# Patient Record
Sex: Female | Born: 1995 | Race: Black or African American | Hispanic: No | Marital: Single | Smoking: Never smoker
Health system: Southern US, Community
[De-identification: ages and names within clinical notes are randomized; demographics above are authoritative.]

## PROBLEM LIST (undated history)

## (undated) HISTORY — PX: HERNIA REPAIR: SHX51

---
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
## 2016-07-07 IMAGING — CR DG TOE GREAT 2+V*L*
3 series · 3 of 3 positions shown · non-contrast
Comparison: None.

CLINICAL DATA: Left great toe pain beginning 1 week ago,
intermittent. Pain is located at the first metatarsophalangeal joint
radiating into the proximal phalanx.

EXAM:
LEFT GREAT TOE

[x toes ap left]
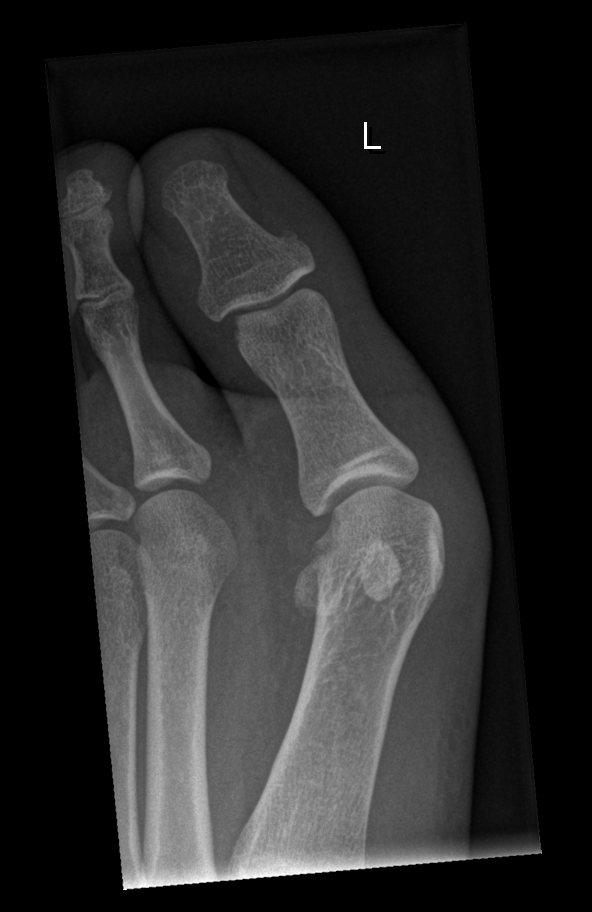

[x toes obl left]
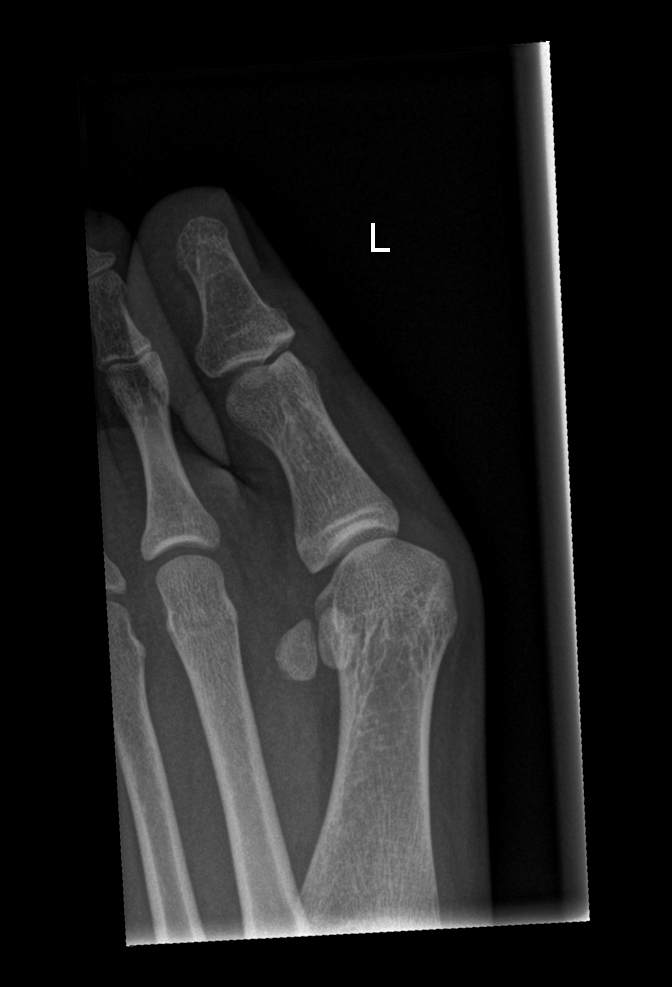

[x toes lat left]
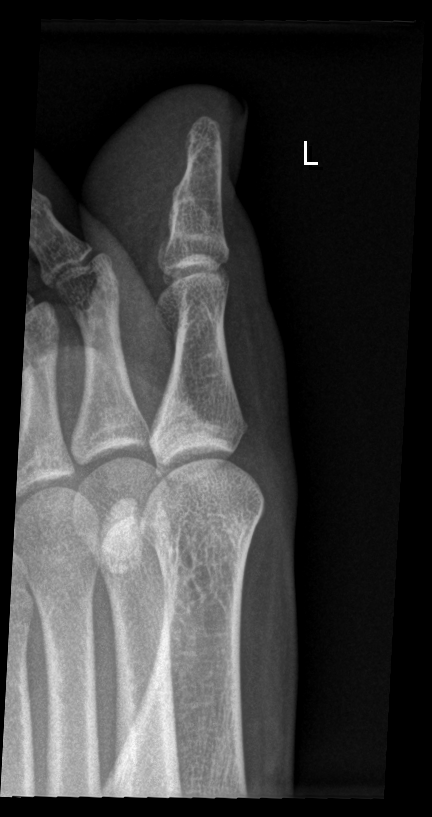

[3 of 3 positions shown; findings below may reference images not displayed]

FINDINGS: There is hallux valgus angulation. No acute fracture or dislocation
is identified. Joint space widths are preserved. No osseous erosion
or soft tissue abnormality is seen.
IMPRESSION: No acute osseous abnormality identified.  Hallux valgus angulation.

## 2017-06-13 ENCOUNTER — Encounter (HOSPITAL_COMMUNITY): Payer: Self-pay | Admitting: *Deleted

## 2017-06-13 ENCOUNTER — Ambulatory Visit (HOSPITAL_COMMUNITY)
Admission: EM | Admit: 2017-06-13 | Discharge: 2017-06-13 | Disposition: A | Discharge status: Home / Self Care | Payer: Managed Care, Other (non HMO) | Attending: Physician Assistant | Admitting: Physician Assistant

## 2017-06-13 DIAGNOSIS — N898 Other specified noninflammatory disorders of vagina: Secondary | ICD-10-CM | POA: Diagnosis not present

## 2017-06-13 DIAGNOSIS — R21 Rash and other nonspecific skin eruption: Secondary | ICD-10-CM | POA: Insufficient documentation

## 2017-06-13 DIAGNOSIS — B373 Candidiasis of vulva and vagina: Secondary | ICD-10-CM | POA: Insufficient documentation

## 2017-06-13 DIAGNOSIS — B3731 Acute candidiasis of vulva and vagina: Secondary | ICD-10-CM

## 2017-06-13 MED ORDER — FLUCONAZOLE 150 MG PO TABS: 150.0000 mg | ORAL_TABLET | Freq: Every day | ORAL | 0 refills | Status: DC

## 2017-06-13 NOTE — Discharge Instructions (Signed)
You have test pending 

## 2017-06-13 NOTE — ED Provider Notes (Signed)
[facility]    CSN: [id number] Arrival date & time: 06/13/17  2004     History   Chief Complaint Chief Complaint  Patient presents with  . Rash    HPI Amber Contreras is a 21 y.o. female.   The history is provided by the patient. No language interpreter was used.  Vaginal Itching  This is a new problem. The current episode started more than 2 days ago. The problem occurs constantly. The problem has been gradually worsening. Nothing aggravates the symptoms. Nothing relieves the symptoms. She has tried nothing for the symptoms. The treatment provided no relief.  Pt is concerned that she has genital warts.  Pt has had in the past.  School rn thought she had.  Pt complains of lumps and itching.  History reviewed. No pertinent past medical history.  There are no active problems to display for this patient.   History reviewed. No pertinent surgical history.  OB History    No data available       Home Medications    Prior to Admission medications   Medication Sig Start Date End Date Taking? Authorizing Provider  fluconazole (DIFLUCAN) 150 MG tablet Take 1 tablet (150 mg total) by mouth daily. 06/13/17   Elson AreasSofia, Yaira Bernardi K, PA-C  naproxen (NAPROSYN) 500 MG tablet Take 1 tablet (500 mg total) by mouth 2 (two) times daily. 12/06/14   Jaynie CrumbleKirichenko, Tatyana, PA-C    Family History History reviewed. No pertinent family history.  Social History Social History  Substance Use Topics  . Smoking status: Never Smoker  . Smokeless tobacco: Not on file  . Alcohol use No     Allergies   Patient has no known allergies.   Review of Systems Review of Systems  All other systems reviewed and are negative.    Physical Exam Triage Vital Signs ED Triage Vitals [06/13/17 2026]  Enc Vitals Group     BP 112/72     Pulse Rate 78     Resp 18     Temp 98.6 F (37 C)     Temp Source Oral     SpO2 99 %     Weight      Height      Head Circumference      Peak Flow    Pain Score      Pain Loc      Pain Edu?      Excl. in GC?    No data found.   Updated Vital Signs BP 112/72 (BP Location: Right Arm)   Pulse 78   Temp 98.6 F (37 C) (Oral)   Resp 18   LMP 05/27/2017   SpO2 99%   Visual Acuity Right Eye Distance:   Left Eye Distance:   Bilateral Distance:    Right Eye Near:   Left Eye Near:    Bilateral Near:     Physical Exam  Constitutional: She appears well-developed and well-nourished.  HENT:  Head: Normocephalic.  Eyes: Pupils are equal, round, and reactive to light.  Cardiovascular: Normal rate and regular rhythm.   Pulmonary/Chest: Effort normal.  Abdominal: Soft.  Genitourinary: Vaginal discharge found.  Genitourinary Comments: Thick white discharge,  No sign of warts.    Musculoskeletal: Normal range of motion.  Neurological: She is alert.  Skin: Skin is warm.  Psychiatric: She has a normal mood and affect.  Nursing note and vitals reviewed.    UC Treatments / Results  Labs (all labs ordered are listed, but  only abnormal results are displayed) Labs Reviewed  CERVICOVAGINAL ANCILLARY ONLY    EKG  EKG Interpretation None       Radiology No results found.  Procedures Procedures (including critical care time)  Medications Ordered in UC Medications - No data to display   Initial Impression / Assessment and Plan / UC Course  I have reviewed the triage vital signs and the nursing notes.  Pertinent labs & imaging results that were available during my care of the patient were reviewed by me and considered in my medical decision making (see chart for details).     Gc/ct/affirm ordered.  I suspect yeast.  Pt given diflucan.  Pt given number for [facility] for follow up  Final Clinical Impressions(s) / UC Diagnoses   Final diagnoses:  Yeast vaginitis    New Prescriptions Discharge Medication List as of 06/13/2017  8:36 PM     Meds ordered this encounter  Medications  . fluconazole (DIFLUCAN) 150  MG tablet    Sig: Take 1 tablet (150 mg total) by mouth daily.    Dispense:  1 tablet    Refill:  0    Order Specific Question:   Supervising Provider    Answer:   Eustace Moore [[id number]]  An After Visit Summary was printed and given to the patient.  Controlled Substance Prescriptions [place] Controlled Substance Registry consulted? No   Elson Areas, [place] 06/13/17 2051

## 2017-06-13 NOTE — ED Triage Notes (Signed)
Pt  Has  Bumps   And  Rash     For   About  5    Days        In  Vaginal   Area     Pt   Also  Reports   Some    abd  Pain      Went  To the   schooll nurse  A  Few  Days  Ago  And  Was  Told  To   Go  To  The  Health   Nurse

## 2017-06-18 ENCOUNTER — Encounter: Payer: Self-pay | Admitting: General Practice

## 2017-06-18 ENCOUNTER — Ambulatory Visit (INDEPENDENT_AMBULATORY_CARE_PROVIDER_SITE_OTHER): Payer: Managed Care, Other (non HMO) | Admitting: Obstetrics & Gynecology

## 2017-06-18 ENCOUNTER — Encounter: Payer: Self-pay | Admitting: Obstetrics & Gynecology

## 2017-06-18 ENCOUNTER — Ambulatory Visit (INDEPENDENT_AMBULATORY_CARE_PROVIDER_SITE_OTHER): Payer: Managed Care, Other (non HMO) | Admitting: Clinical

## 2017-06-18 DIAGNOSIS — F4323 Adjustment disorder with mixed anxiety and depressed mood: Secondary | ICD-10-CM

## 2017-06-18 DIAGNOSIS — L739 Follicular disorder, unspecified: Secondary | ICD-10-CM | POA: Diagnosis not present

## 2017-06-18 MED ORDER — SULFAMETHOXAZOLE-TRIMETHOPRIM 800-160 MG PO TABS: 1.0000 | ORAL_TABLET | Freq: Two times a day (BID) | ORAL | 0 refills | Status: DC

## 2017-06-18 MED ORDER — FLUCONAZOLE 150 MG PO TABS: 150.0000 mg | ORAL_TABLET | Freq: Every day | ORAL | 0 refills | Status: DC

## 2017-06-18 NOTE — BH Specialist Note (Signed)
Integrated Behavioral Health Initial Visit  MRN: [id number] Name: Amber Contreras   Session Start time: 4:20 Session End time: 4:38 Total time: 20 minutes  Type of Service: Integrated Behavioral Health- Individual/Family Interpretor:No. Interpretor Name and Language: n/a   Warm Hand Off Completed.       SUBJECTIVE: Amber Contreras is a 21 y.o. female accompanied by patient. Patient was referred by Dr Debroah LoopArnold for anxiety and depression. Patient reports the following symptoms/concerns: Pt states her primary concern is stress over increased responsibilities at school; open to learning coping strategy.  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 Patient may benefit from psychoeducation and brief therapeutic interventions regarding coping with symptoms of anxiety and depression.  PLAN: 1. Follow up with behavioral health clinician on : As needed 2. Behavioral recommendations:  -Attend appointment with school guidance counselor on 06-24-17 -Continue using Calm app on phone, as needed -Consider practicing CALM relaxation breathing exercises with breakfast  -Read educational materials regarding coping with  symptoms of anxiety and depression 3. Referral(s): [facility] 4. "From scale of 1-10, how likely are you to follow plan?": 7  Rae LipsJamie C Sania Noy, LCSWA Depression screen [facility] 2/9 06/18/2017  Decreased Interest 3  Down, Depressed, Hopeless 1  PHQ - 2 Score 4  Altered sleeping 2  Tired, decreased energy 3  Change in appetite 3  Feeling bad or failure about yourself  1  Trouble concentrating 0  Moving slowly or fidgety/restless 0  Suicidal thoughts 0  PHQ-9 Score 13   GAD 7 : Generalized Anxiety Score 06/18/2017  Nervous, Anxious, on Edge 2  Control/stop worrying 2  Worry too much - different things 2  Trouble relaxing 2  Restless 0  Easily annoyed or irritable 1  Afraid - awful might happen 3  Total GAD 7 Score 12

## 2017-06-18 NOTE — Progress Notes (Signed)
Obstetrics and Gynecology Visit New Patient Evaluation  Appointment Date: 06/18/2017  Primary Care Provider: Patient, No Pcp Per  Referring Provider: No ref. provider found  Chief Complaint: Vaginal itching  History of Present Illness:  Amber Contreras is a 21 y.o. G1P0010 who presents for ED f/u for yeast infection. [facility] with ddx of yeast infection and discharged on 150mg  oral diflucan. She reports that they do not look like pimples, but she can express pus when she presses on them.   Gyn history:  She [place] Ob/Gyn [place] who she has been seeing since age 21. She initiated care for TAB. 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 She stated that she had cryo procedure at 21yo. She states that previous abdominal pain stopped after the cryo procedure. She reports that she had to go every [id number] for pap smears, and her provider would not give depo shot without pap smear. She has questionable hx of genital warts for which she was taking a cream. She stopped the depo shot in November and is not currently sexually active. She reports regular menstrual cycles. [facility].  Interval History: Since the ED, she states that she has had persistent bumps. She now reports itchiness around the vulva. Itching associated with bumps.   Review of Systems: Positive for diarrhea for 1wk with + bright red blood in the stool. Otherwise, her 12 point review of systems is negative or as noted in the History of Present Illness.  The following portions of the patient's history were reviewed and updated as appropriate: allergies, current medications, past family history, past medical history, past social history, past surgical history and problem list.  Patient Active Problem List   Diagnosis Date Noted  . Folliculitis 06/18/2017   Medications: None Allergies: has No Known  Allergies.  Physical Exam:  LMP 05/27/2017  There is no height or weight on file to calculate BMI. General appearance: Well nourished, well developed female in no acute distress.  Abdomen: diffusely non tender to palpation, non distended, and no masses, hernias Neuro/Psych:  Normal mood and affect.   Pelvic exam:  Vulva: Small scattered pustules visible on mons and vulva without surround erythema or fluctuance Vagina: Blood visible in vaginal vault, no discharge appreciated, normal odor Cervix: posterior orientation, blood expressing from os, no discharge Uterus: uterus is normal size, shape, consistency and nontender Adnexa: normal adnexa in size, nontender and no masses.   Assessment: History and physical exam consistent with mild folliculitis. Pt beginning menstrual cycle.  Plan: 1. Folliculitis - 7d course of oral bactrim  - Return precautions given regarding new or worsening symptoms.  2. Health maintenance - Discussed current guidelines for pap smears and provided reassurance - Pt instructed to return at age 21 for pap per guidelines  RTC: prn  Dannette Barbararew Jamile Sivils, Medical Student

## 2017-06-18 NOTE — Patient Instructions (Signed)
Pap Test Why am I having this test? A pap test is sometimes called a pap smear. It is a screening test that is used to check for signs of cancer of the vagina, cervix, and uterus. The test can also identify the presence of infection or precancerous changes. Your health care provider will likely recommend you have this test done on a regular basis. This test may be done:  Every 3 years, starting at age 21.  Every 5 years, in combination with testing for the presence of human papillomavirus (HPV).after age 21  More or less often depending on other medical conditions.  What kind of sample is taken? Using a small cotton swab, plastic spatula, or brush, your health care provider will collect a sample of cells from the surface of your cervix. Your cervix is the opening to your uterus, also called a womb. Secretions from the cervix and vagina may also be collected. How do I prepare for this test?  Be aware of where you are in your menstrual cycle. You may be asked to reschedule the test if you are menstruating on the day of the test.  You may need to reschedule if you have a known vaginal infection on the day of the test.  You may be asked to avoid douching or taking a bath the day before or the day of the test.  Some medicines can cause abnormal test results, such as digitalis and tetracycline. Talk with your health care provider before your test if you take one of these medicines. What do the results mean? Abnormal test results may indicate a number of health conditions. These may include:  Cancer. Although pap test results cannot be used to diagnose cancer of the cervix, vagina, or uterus, they may suggest the possibility of cancer. Further tests would be required to determine if cancer is present.  Sexually transmitted disease.  Fungal infection.  Parasite infection.  Herpes infection.  A condition causing or contributing to infertility.  It is your responsibility to obtain your test  results. Ask the lab or department performing the test when and how you will get your results. Contact your health care provider to discuss any questions you have about your results. Talk with your health care provider to discuss your results, treatment options, and if necessary, the need for more tests. Talk with your health care provider if you have any questions about your results. This information is not intended to replace advice given to you by your health care provider. Make sure you discuss any questions you have with your health care provider. Document Released: 12/21/2002 Document Revised: 06/05/2016 Document Reviewed: 02/21/2014 Elsevier Interactive Patient Education  [employer]2018 Elsevier Inc.

## 2017-06-19 LAB — CERVICOVAGINAL ANCILLARY ONLY
Bacterial vaginitis: NEGATIVE
CHLAMYDIA, DNA PROBE: NEGATIVE
Candida vaginitis: POSITIVE — AB
Neisseria Gonorrhea: NEGATIVE
Trichomonas: NEGATIVE

## 2017-09-10 ENCOUNTER — Other Ambulatory Visit: Payer: Self-pay

## 2017-09-10 ENCOUNTER — Encounter (HOSPITAL_COMMUNITY): Payer: Self-pay | Admitting: Family Medicine

## 2017-09-10 ENCOUNTER — Ambulatory Visit (HOSPITAL_COMMUNITY)
Admission: EM | Admit: 2017-09-10 | Discharge: 2017-09-10 | Disposition: A | Discharge status: Home / Self Care | Payer: Managed Care, Other (non HMO) | Attending: Family Medicine | Admitting: Family Medicine

## 2017-09-10 DIAGNOSIS — R21 Rash and other nonspecific skin eruption: Secondary | ICD-10-CM | POA: Diagnosis present

## 2017-09-10 DIAGNOSIS — Z113 Encounter for screening for infections with a predominantly sexual mode of transmission: Secondary | ICD-10-CM | POA: Diagnosis present

## 2017-09-10 DIAGNOSIS — J029 Acute pharyngitis, unspecified: Secondary | ICD-10-CM | POA: Diagnosis not present

## 2017-09-10 DIAGNOSIS — B349 Viral infection, unspecified: Secondary | ICD-10-CM | POA: Diagnosis not present

## 2017-09-10 DIAGNOSIS — L42 Pityriasis rosea: Secondary | ICD-10-CM | POA: Diagnosis not present

## 2017-09-10 LAB — POCT PREGNANCY, URINE: Preg Test, Ur: NEGATIVE

## 2017-09-10 MED ORDER — CHLORHEXIDINE GLUCONATE 0.12 % MT SOLN: 15.0000 mL | Freq: Two times a day (BID) | OROMUCOSAL | 0 refills | Status: DC

## 2017-09-10 NOTE — Discharge Instructions (Signed)
Zyrtec OTC for the itching at night.

## 2017-09-10 NOTE — ED Provider Notes (Signed)
  [facility]   [id number] 09/10/17 Arrival Time: 1629   SUBJECTIVE:  Amber Shawllexis Contreras is a 21 y.o. female who presents to the urgent care with complaint of unprotected sexual intercourse 5 days ago.  Pt concerned for pregnancy.  Pt was in [place] last week and now has a rash on her torso that appeared when she returned home.  She denies any fever.  [employer] senior majoring in Investment banker, corporatepolitical science.  History reviewed. No pertinent past medical history. No family history on file. Social History   Socioeconomic History  . Marital status: Single    Spouse name: Not on file  . Number of children: Not on file  . Years of education: Not on file  . Highest education level: Not on file  Social Needs  . Financial resource strain: Not on file  . Food insecurity - worry: Not on file  . Food insecurity - inability: Not on file  . Transportation needs - medical: Not on file  . Transportation needs - non-medical: Not on file  Occupational History  . Not on file  Tobacco Use  . Smoking status: Never Smoker  . Smokeless tobacco: Never Used  Substance and Sexual Activity  . Alcohol use: No  . Drug use: No  . Sexual activity: Not on file  Other Topics Concern  . Not on file  Social History Narrative  . Not on file   No outpatient medications have been marked as taking for the 09/10/17 encounter [facility](Hospital Encounter).   No Known Allergies    ROS: As per HPI, remainder of ROS negative.   OBJECTIVE:   Vitals:   09/10/17 1653  BP: (!) 141/69  Pulse: 68  Temp: 98.5 F (36.9 C)  TempSrc: Oral  SpO2: 99%     General appearance: alert; no distress Eyes: PERRL; EOMI; conjunctiva normal HENT: normocephalic; atraumatic; TMs normal, canal normal, external ears normal without trauma; nasal mucosa normal; oral mucosa normal Neck: supple Lungs: clear to auscultation bilaterally Heart: regular rate and rhythm Abdomen: soft, non-tender; bowel sounds normal; no masses or  organomegaly; no guarding or rebound tenderness Back: no CVA tenderness Extremities: no cyanosis or edema; symmetrical with no gross deformities Skin: warm and dry; elliptical papules with one large "herald" patch right inguinal area, papules align with dermatomes. Neurologic: normal gait; grossly normal Psychological: alert and cooperative; normal mood and affect      Labs:  Results for orders placed or performed during the hospital encounter of 06/13/17  Cervicovaginal ancillary only  Result Value Ref Range   Bacterial vaginitis Negative for Bacterial Vaginitis Microorganisms    Candida vaginitis **POSITIVE for Candida species** (A)    Chlamydia Negative    Neisseria gonorrhea Negative    Trichomonas Negative     Labs Reviewed - No data to display  No results found.     ASSESSMENT & PLAN:  No diagnosis found.  No orders of the defined types were placed in this encounter.   Reviewed expectations re: course of current medical issues. Questions answered. Outlined signs and symptoms indicating need for more acute intervention. Patient verbalized understanding. After Visit Summary given.    Procedures:      Elvina SidleLauenstein, Braiden Rodman, MD 09/10/17 [phone]

## 2017-09-10 NOTE — ED Triage Notes (Addendum)
Pt reports unprotected sexual intercourse 5 days ago.  Pt concerned for pregnancy.  Pt was in [place] last week and now has a rash on her torso that appeared when she returned home.  She denies any fever.

## 2017-09-10 NOTE — ED Notes (Signed)
Patient discharged by provider. VS stable. Patient ambulatory with steady gait.  

## 2017-09-11 LAB — URINE CYTOLOGY ANCILLARY ONLY
Chlamydia: NEGATIVE
Neisseria Gonorrhea: NEGATIVE
Trichomonas: NEGATIVE

## 2017-11-16 ENCOUNTER — Ambulatory Visit (INDEPENDENT_AMBULATORY_CARE_PROVIDER_SITE_OTHER): Payer: Managed Care, Other (non HMO)

## 2017-11-16 ENCOUNTER — Encounter (HOSPITAL_COMMUNITY): Payer: Self-pay | Admitting: Emergency Medicine

## 2017-11-16 ENCOUNTER — Ambulatory Visit (HOSPITAL_COMMUNITY)
Admission: EM | Admit: 2017-11-16 | Discharge: 2017-11-16 | Disposition: A | Discharge status: Home / Self Care | Payer: Managed Care, Other (non HMO) | Attending: Family Medicine | Admitting: Family Medicine

## 2017-11-16 DIAGNOSIS — R509 Fever, unspecified: Secondary | ICD-10-CM | POA: Diagnosis not present

## 2017-11-16 DIAGNOSIS — N898 Other specified noninflammatory disorders of vagina: Secondary | ICD-10-CM

## 2017-11-16 DIAGNOSIS — R5383 Other fatigue: Secondary | ICD-10-CM | POA: Insufficient documentation

## 2017-11-16 DIAGNOSIS — J069 Acute upper respiratory infection, unspecified: Secondary | ICD-10-CM | POA: Diagnosis present

## 2017-11-16 DIAGNOSIS — L739 Follicular disorder, unspecified: Secondary | ICD-10-CM | POA: Diagnosis not present

## 2017-11-16 DIAGNOSIS — J40 Bronchitis, not specified as acute or chronic: Secondary | ICD-10-CM | POA: Diagnosis not present

## 2017-11-16 DIAGNOSIS — R05 Cough: Secondary | ICD-10-CM | POA: Insufficient documentation

## 2017-11-16 DIAGNOSIS — R059 Cough, unspecified: Secondary | ICD-10-CM

## 2017-11-16 LAB — POCT PREGNANCY, URINE: PREG TEST UR: NEGATIVE

## 2017-11-16 MED ORDER — METRONIDAZOLE 0.75 % VA GEL: 1.0000 | Freq: Two times a day (BID) | VAGINAL | 0 refills | Status: AC

## 2017-11-16 MED ORDER — PREDNISONE 5 MG (21) PO TBPK: ORAL_TABLET | ORAL | 0 refills | Status: DC

## 2017-11-16 MED ORDER — FLUCONAZOLE 150 MG PO TABS: 150.0000 mg | ORAL_TABLET | Freq: Every day | ORAL | 0 refills | Status: DC

## 2017-11-16 MED ORDER — METHYLPREDNISOLONE ACETATE 80 MG/ML IJ SUSP
INTRAMUSCULAR | Status: AC
  Filled 2017-11-16: qty 1

## 2017-11-16 MED ORDER — AZITHROMYCIN 250 MG PO TABS: 250.0000 mg | ORAL_TABLET | Freq: Every day | ORAL | 0 refills | Status: DC

## 2017-11-16 NOTE — ED Triage Notes (Signed)
PT C/O: dry cough onset 1 month +++ associated w/cold sx, headache, nasal drainage/congestion, body aches, night sweats  Also c/o yeast infection associated w/vag d/c onset 4 days  Sexually active in a monogamous relationship and uses condoms.   TAKING MEDS: Robitussin   A&O x4... NAD... Ambulatory

## 2017-11-16 NOTE — Discharge Instructions (Signed)
Treat bronchitis with prednisone pack for inflammation, cover with antibiotic given the duration of symptoms. Drink plenty of fluids. FU if not improving.  For vaginal discharge treat with Metro gel for 5 days and if the results show something other than BV then will call you for further instructions.

## 2017-11-16 NOTE — ED Provider Notes (Addendum)
[facility]    CSN: [id number] Arrival date & time: 11/16/17  1550     History   Chief Complaint Chief Complaint  Patient presents with  . URI    HPI Amber Contreras is a 22 y.o. female.   Presents with  2 concerns.  First she notes a cough x 1 month, originally non-productive and now productive, yellow with blood tinged at times. She reports low grade subjective fevers and fatigue. No nasal congestion or sore throat is noted. She has noted some mild night wheeze.  Second she reports a odorous discharge x 4 days. She had a pap recently which was normal. She denies itching or abdominal pain. See full ROS      History reviewed. No pertinent past medical history.  Patient Active Problem List   Diagnosis Date Noted  . Folliculitis 06/18/2017    Past Surgical History:  Procedure Laterality Date  . HERNIA REPAIR      OB History    No data available       Home Medications    Prior to Admission medications   Medication Sig Start Date End Date Taking? Authorizing Provider  chlorhexidine (PERIDEX) 0.12 % solution Use as directed 15 mLs in the mouth or throat 2 (two) times daily. 09/10/17   Elvina SidleLauenstein, Kurt, MD    Family History History reviewed. No pertinent family history.  Social History Social History   Tobacco Use  . Smoking status: Never Smoker  . Smokeless tobacco: Never Used  Substance Use Topics  . Alcohol use: No  . Drug use: No     Allergies   Patient has no known allergies.   Review of Systems Review of Systems  Constitutional: Positive for fatigue and fever.  HENT: Positive for congestion, sinus pain and sore throat.   Eyes: Negative.   Respiratory: Positive for cough and wheezing. Negative for shortness of breath.   Cardiovascular: Negative for chest pain.  Gastrointestinal: Negative for abdominal distention, abdominal pain and nausea.  Genitourinary: Positive for vaginal discharge. Negative for decreased urine volume,  difficulty urinating, dysuria, flank pain, frequency, genital sores and pelvic pain.  Musculoskeletal: Negative.   Hematological: Negative for adenopathy.  Psychiatric/Behavioral: Negative.      Physical Exam Triage Vital Signs ED Triage Vitals [11/16/17 1603]  Enc Vitals Group     BP 104/73     Pulse Rate 70     Resp 20     Temp 98.6 F (37 C)     Temp Source Oral     SpO2 98 %     Weight      Height      Head Circumference      Peak Flow      Pain Score      Pain Loc      Pain Edu?      Excl. in GC?    No data found.  Updated Vital Signs BP 104/73 (BP Location: Left Arm)   Pulse 70   Temp 98.6 F (37 C) (Oral)   Resp 20   LMP 11/09/2017   SpO2 98%   Visual Acuity Right Eye Distance:   Left Eye Distance:   Bilateral Distance:    Right Eye Near:   Left Eye Near:    Bilateral Near:     Physical Exam  Constitutional: She is oriented to person, place, and time. She appears well-developed and well-nourished. No distress.  HENT:  Head: Normocephalic and atraumatic.  Neck: Neck supple.  Cardiovascular: Normal rate and regular rhythm.  Pulmonary/Chest: Effort normal. She has wheezes.  Basilar mild expiratory wheeze and few scattered rhonci  Abdominal: Soft. Bowel sounds are normal.  Neurological: She is alert and oriented to person, place, and time.  Skin: Skin is warm and dry. She is not diaphoretic.  Psychiatric: Her behavior is normal.  Nursing note and vitals reviewed.    UC Treatments / Results  Labs (all labs ordered are listed, but only abnormal results are displayed) Labs Reviewed  URINE CYTOLOGY ANCILLARY ONLY    EKG  EKG Interpretation None       Radiology No results found.  Procedures Procedures (including critical care time)  Medications Ordered in UC Medications - No data to display   Initial Impression / Assessment and Plan / UC Course  I have reviewed the triage vital signs and the nursing notes.  Pertinent labs &  imaging results that were available during my care of the patient were reviewed by me and considered in my medical decision making (see chart for details).     Treat vaginal discharge with Metrogel and await final culture results.  Treat bronchitis with Z-pack (based on duration of symptoms) and prednisone pack. Supportive are with OTC cough suppressant as well. FU if worsens. She requested Diflucan for possible yeast infection as well-RX send in  Final Clinical Impressions(s) / UC Diagnoses   Final diagnoses:  None    ED Discharge Orders    None       Controlled Substance Prescriptions [place] Controlled Substance Registry consulted? Not Applicable   Sharin Mons 11/16/17 1716    Riki Sheer, PA-C 11/16/17 1723

## 2017-11-19 LAB — URINE CYTOLOGY ANCILLARY ONLY: Candida vaginitis: NEGATIVE

## 2018-02-27 ENCOUNTER — Encounter (HOSPITAL_COMMUNITY): Payer: Self-pay

## 2018-02-27 ENCOUNTER — Ambulatory Visit (HOSPITAL_COMMUNITY)
Admission: EM | Admit: 2018-02-27 | Discharge: 2018-02-27 | Disposition: A | Discharge status: Home / Self Care | Payer: Managed Care, Other (non HMO) | Attending: Family Medicine | Admitting: Family Medicine

## 2018-02-27 DIAGNOSIS — R110 Nausea: Secondary | ICD-10-CM | POA: Diagnosis not present

## 2018-02-27 DIAGNOSIS — R51 Headache: Secondary | ICD-10-CM | POA: Diagnosis not present

## 2018-02-27 DIAGNOSIS — H53149 Visual discomfort, unspecified: Secondary | ICD-10-CM

## 2018-02-27 DIAGNOSIS — R519 Headache, unspecified: Secondary | ICD-10-CM

## 2018-02-27 DIAGNOSIS — R05 Cough: Secondary | ICD-10-CM | POA: Diagnosis not present

## 2018-02-27 MED ORDER — KETOROLAC TROMETHAMINE 60 MG/2ML IM SOLN
INTRAMUSCULAR | Status: AC
  Filled 2018-02-27: qty 2

## 2018-02-27 MED ORDER — KETOROLAC TROMETHAMINE 60 MG/2ML IM SOLN
60.0000 mg | Freq: Once | INTRAMUSCULAR | Status: AC
  Administered 2018-02-27: 60 mg via INTRAMUSCULAR

## 2018-02-27 MED ORDER — METOCLOPRAMIDE HCL 5 MG/ML IJ SOLN
5.0000 mg | Freq: Once | INTRAMUSCULAR | Status: AC
Start: 2018-02-27 — End: 2018-02-27
  Administered 2018-02-27: 5 mg via INTRAMUSCULAR

## 2018-02-27 MED ORDER — METOCLOPRAMIDE HCL 5 MG/ML IJ SOLN
INTRAMUSCULAR | Status: AC
  Filled 2018-02-27: qty 2

## 2018-02-27 MED ORDER — DEXAMETHASONE SODIUM PHOSPHATE 10 MG/ML IJ SOLN
10.0000 mg | Freq: Once | INTRAMUSCULAR | Status: AC
Start: 2018-02-27 — End: 2018-02-27
  Administered 2018-02-27: 10 mg via INTRAMUSCULAR

## 2018-02-27 MED ORDER — DEXAMETHASONE SODIUM PHOSPHATE 10 MG/ML IJ SOLN
INTRAMUSCULAR | Status: AC
  Filled 2018-02-27: qty 1

## 2018-02-27 MED ORDER — IBUPROFEN 800 MG PO TABS: 800.0000 mg | ORAL_TABLET | Freq: Three times a day (TID) | ORAL | 0 refills | Status: AC

## 2018-02-27 NOTE — ED Triage Notes (Signed)
Pt presents with complaints of headache after eating [employer] and an off and on cough for a month. Reports loosing 30 pounds in 1 days.

## 2018-02-27 NOTE — Discharge Instructions (Signed)
Take ibuprofen 8oo mg for headache Take with food Need to follow up with a PCP about the chronic cough We will refer you for help with PCP

## 2018-02-27 NOTE — ED Provider Notes (Signed)
[facility]    CSN: [id number] Arrival date & time: 02/27/18  1610     History   Chief Complaint Chief Complaint  Patient presents with  . Headache  . Cough    HPI Amber Contreras is a 22 y.o. female.   HPI  Patient complains of cough.  She says she has had a cough off and on for months and months.  I discussed with her that this can be from allergies, cough variant asthma, GERD, bronchial irritation from a bronchitis that persists.  Her cough is moderate at this time and I recommend she see a PCP for follow-up. She also has a "severe" headache.  She is never had a headache like this before.  She states is started after eating [employer] yesterday.  She thinks that something she ate did not agree with her.  She has mild photophobia.  Headaches all over her head.  Nausea but no vomiting.  No prior history of migraines.  No neck stiffness.  No fever or chills.  No cold or sinus symptoms.  History reviewed. No pertinent past medical history.  Patient Active Problem List   Diagnosis Date Noted  . Folliculitis 06/18/2017    Past Surgical History:  Procedure Laterality Date  . HERNIA REPAIR      OB History   None      Home Medications    Prior to Admission medications   Medication Sig Start Date End Date Taking? Authorizing Provider  ibuprofen (ADVIL,MOTRIN) 800 MG tablet Take 1 tablet (800 mg total) by mouth 3 (three) times daily. 02/27/18   Eustace Moore, MD    Family History No family history on file.  Social History Social History   Tobacco Use  . Smoking status: Never Smoker  . Smokeless tobacco: Never Used  Substance Use Topics  . Alcohol use: No  . Drug use: No     Allergies   Patient has no known allergies.   Review of Systems Review of Systems  Constitutional: Negative for chills and fever.  HENT: Negative for congestion, ear pain and sore throat.   Eyes: Positive for photophobia. Negative for pain and visual disturbance.    Respiratory: Negative for cough and shortness of breath.   Cardiovascular: Negative for chest pain and palpitations.  Gastrointestinal: Positive for nausea. Negative for abdominal pain and vomiting.  Genitourinary: Negative for dysuria and hematuria.  Musculoskeletal: Negative for arthralgias and back pain.  Skin: Negative for color change and rash.  Neurological: Positive for headaches. Negative for seizures and syncope.  All other systems reviewed and are negative.    Physical Exam Triage Vital Signs ED Triage Vitals  Enc Vitals Group     BP 02/27/18 1637 100/62     Pulse Rate 02/27/18 1637 66     Resp 02/27/18 1637 20     Temp 02/27/18 1637 98.4 F (36.9 C)     Temp Source 02/27/18 1637 Oral     SpO2 02/27/18 1637 99 %     Weight --      Height --      Head Circumference --      Peak Flow --      Pain Score 02/27/18 1646 7     Pain Loc --      Pain Edu? --      Excl. in GC? --    No data found.  Updated Vital Signs BP 100/62 (BP Location: Right Arm)   Pulse 66   Temp  98.4 F (36.9 C) (Oral)   Resp 20   LMP 02/27/2018   SpO2 99%   Visual Acuity Right Eye Distance:   Left Eye Distance:   Bilateral Distance:    Right Eye Near:   Left Eye Near:    Bilateral Near:     Physical Exam  Constitutional: She is oriented to person, place, and time. She appears well-developed and well-nourished. She appears ill. No distress.  Appears in significant discomfort  HENT:  Head: Normocephalic and atraumatic.  Right Ear: External ear normal.  Left Ear: External ear normal.  Nose: Nose normal.  Mouth/Throat: Oropharynx is clear and moist.  Eyes: Pupils are equal, round, and reactive to light. Conjunctivae and EOM are normal.  Disks are flat  Neck: Normal range of motion. Neck supple. No thyromegaly present.  Cardiovascular: Normal rate, regular rhythm and normal heart sounds.  No murmur heard. Pulmonary/Chest: Effort normal and breath sounds normal. No respiratory  distress. She has no wheezes.  Abdominal: Soft. Bowel sounds are normal. There is no tenderness.  Musculoskeletal: Normal range of motion. She exhibits no edema.  Lymphadenopathy:    She has no cervical adenopathy.  Neurological: She is alert and oriented to person, place, and time. She has normal reflexes.  Skin: Skin is warm and dry. No rash noted.  Psychiatric: She has a normal mood and affect. Her behavior is normal.  Nursing note and vitals reviewed. After injection, 45 minutes later, patient states she feels well.   UC Treatments / Results  Labs (all labs ordered are listed, but only abnormal results are displayed) Labs Reviewed - No data to display  EKG None  Radiology No results found.  Procedures Procedures (including critical care time)  Medications Ordered in UC Medications  ketorolac (TORADOL) injection 60 mg (60 mg Intramuscular Given 02/27/18 1754)  metoCLOPramide (REGLAN) injection 5 mg (5 mg Intramuscular Given 02/27/18 1754)  dexamethasone (DECADRON) injection 10 mg (10 mg Intramuscular Given 02/27/18 1755)    Initial Impression / Assessment and Plan / UC Course  I have reviewed the triage vital signs and the nursing notes.  Pertinent labs & imaging results that were available during my care of the patient were reviewed by me and considered in my medical decision making (see chart for details).     This sounds like a migraine variant.  I gave her migraine medication and she showed significant improvement.  She is never had migraines before and believes it from food poisoning.  She has a PCP where she lives in [place] and will follow up appropriately. Final Clinical Impressions(s) / UC Diagnoses   Final diagnoses:  Bad headache     Discharge Instructions     Take ibuprofen 8oo mg for headache Take with food Need to follow up with a PCP about the chronic cough We will refer you for help with PCP    ED Prescriptions    Medication Sig Dispense  Auth. Provider   ibuprofen (ADVIL,MOTRIN) 800 MG tablet Take 1 tablet (800 mg total) by mouth 3 (three) times daily. 21 tablet Eustace Moore, MD     Controlled Substance Prescriptions [place] Controlled Substance Registry consulted? Not Applicable   Eustace Moore, MD 02/27/18 2118

## 2019-06-18 IMAGING — DX DG CHEST 2V
2 series · 2 of 2 positions shown · non-contrast
Comparison: None.

CLINICAL DATA: Cough and low-grade fever

EXAM:
CHEST  2 VIEW

[chest pa]
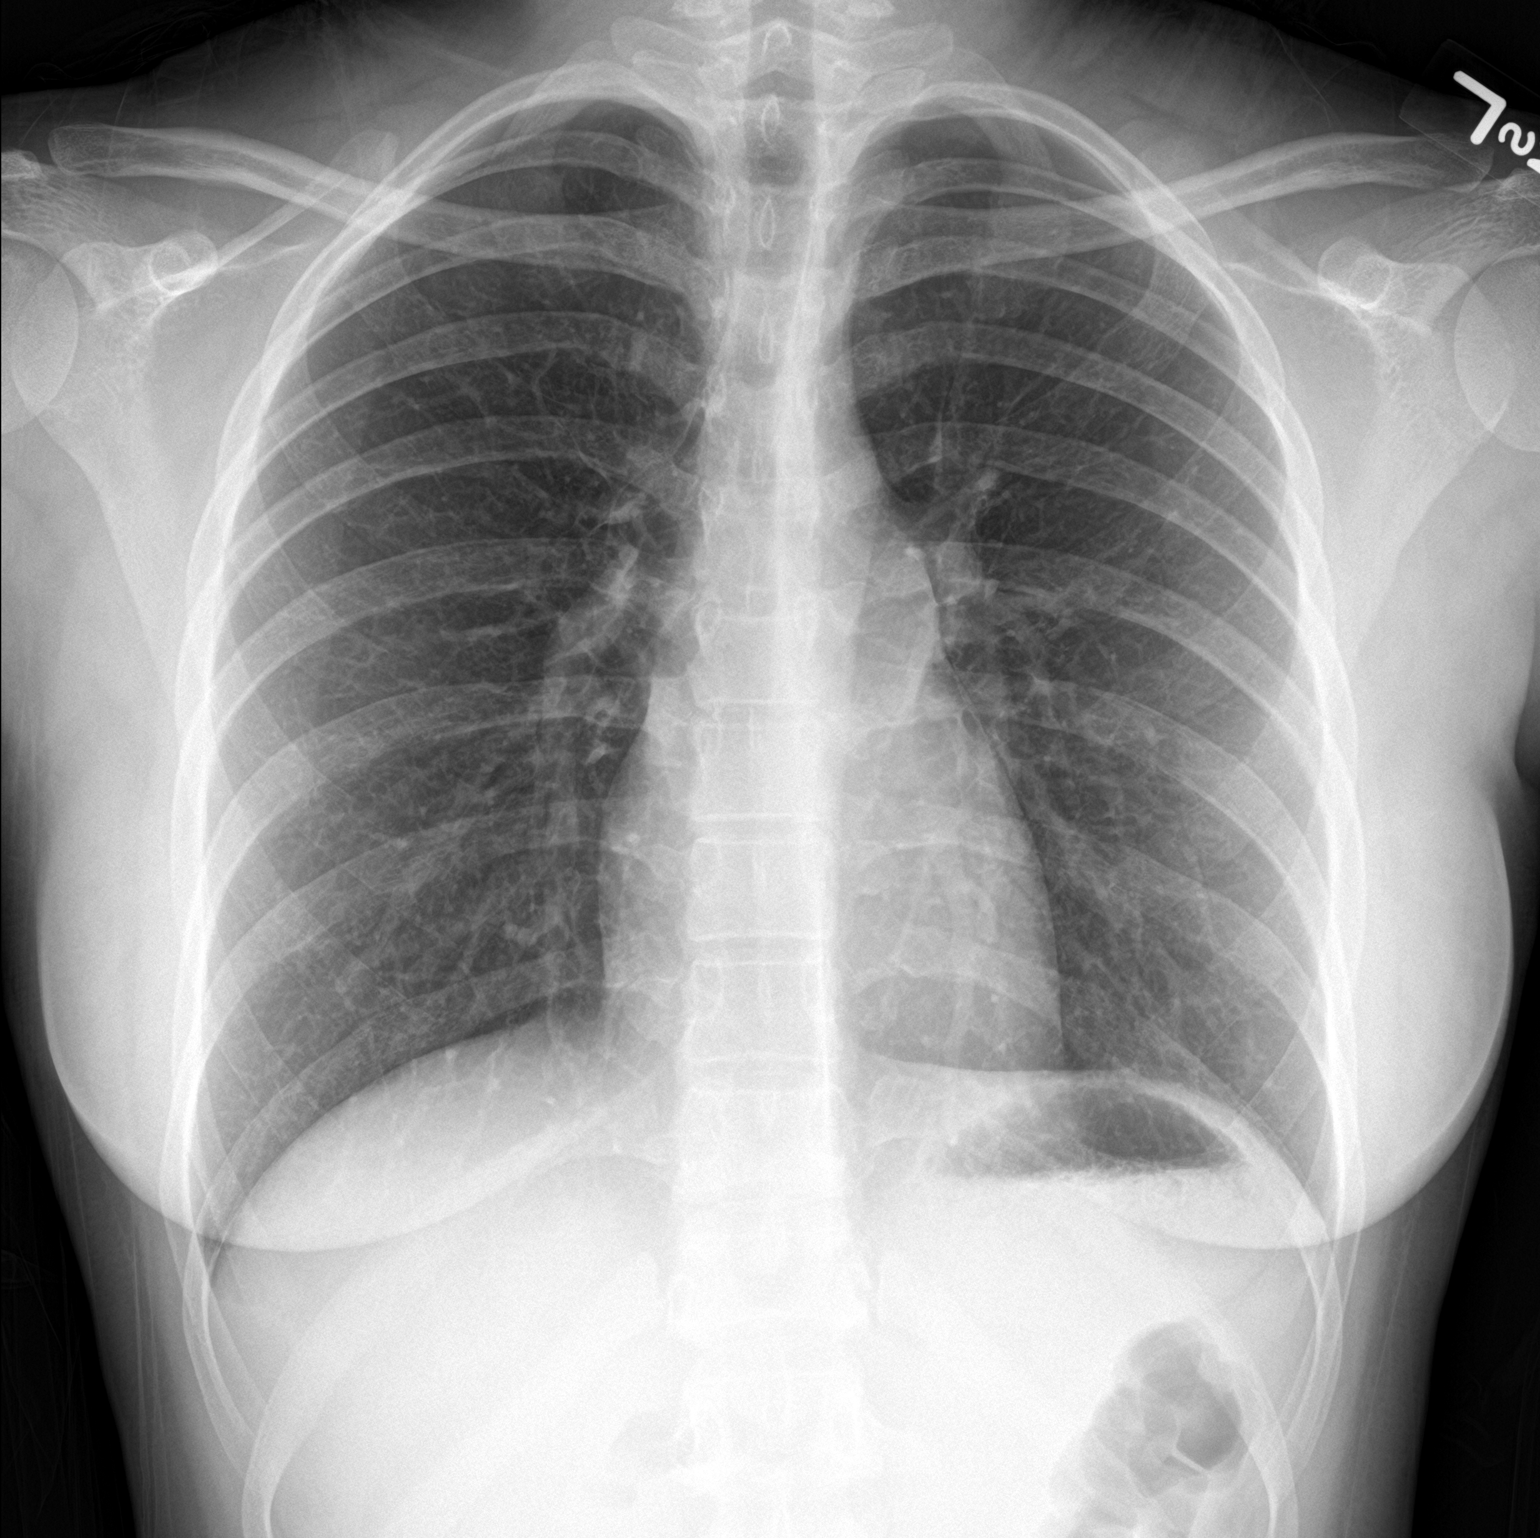

[chest lat]
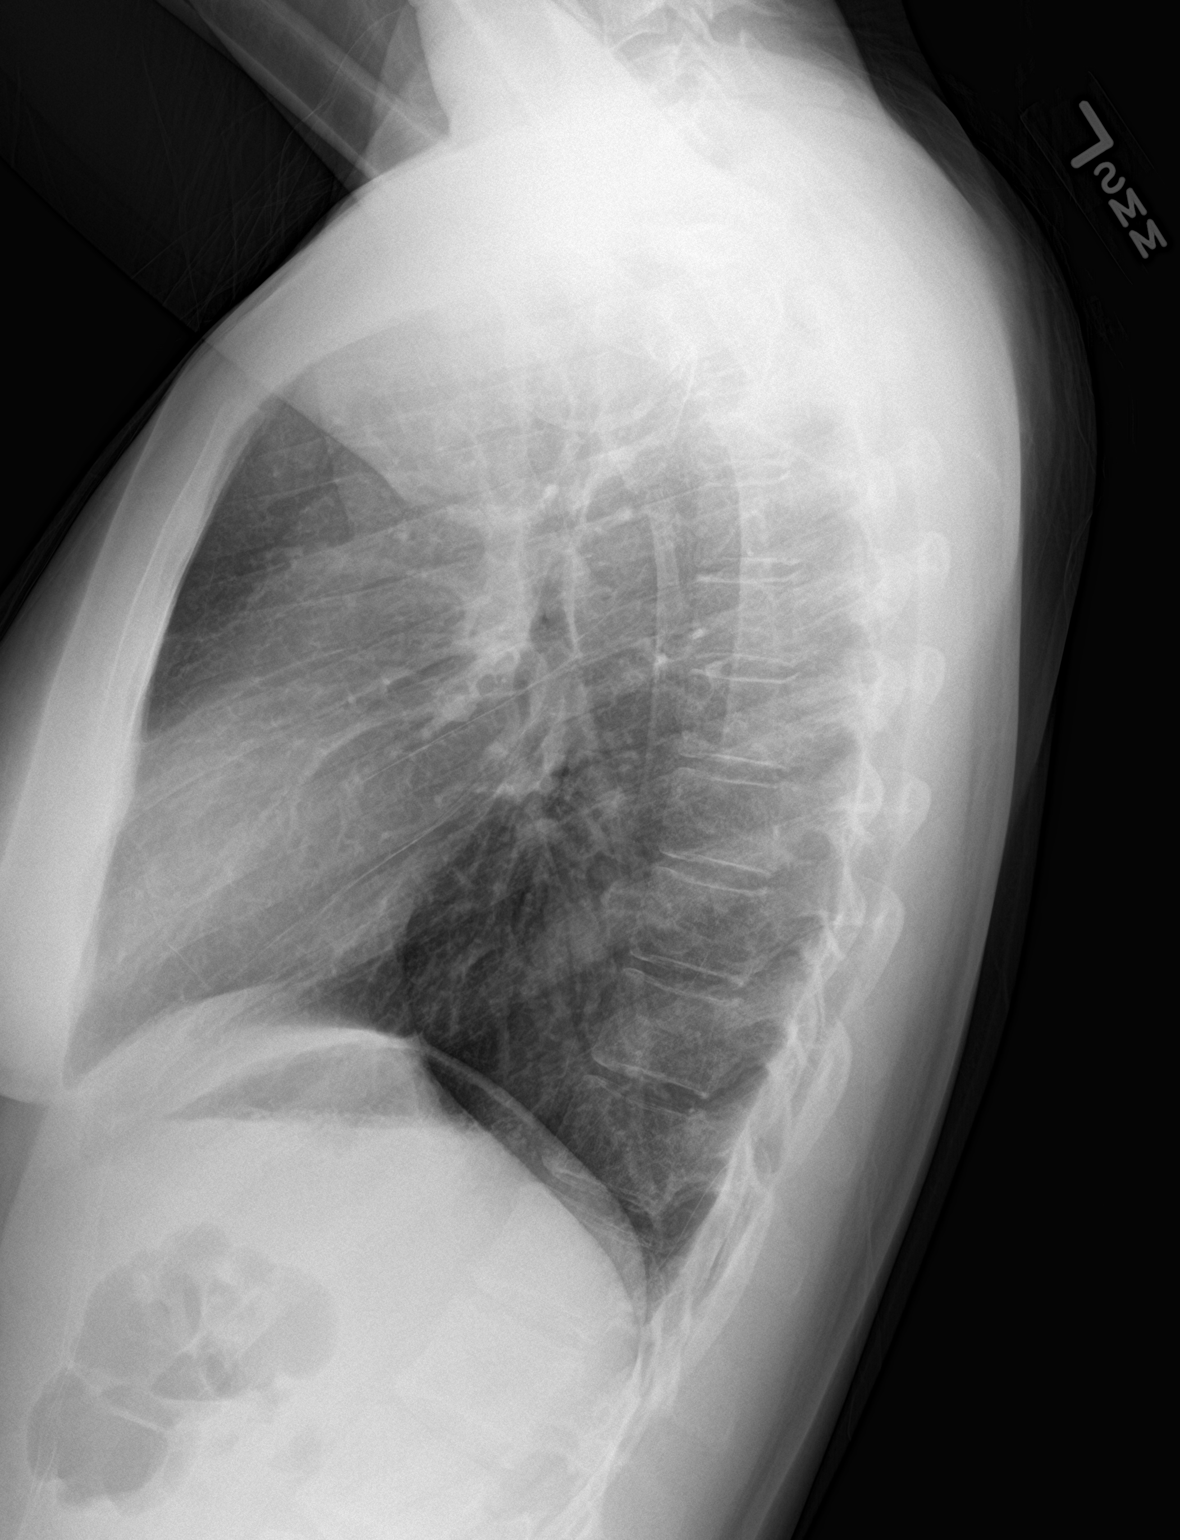

[2 of 2 positions shown; findings below may reference images not displayed]

FINDINGS: The heart, hila, and mediastinum are normal. No pulmonary nodules or
masses. No focal infiltrates. No pneumothorax.
IMPRESSION: No active cardiopulmonary disease.
